# Patient Record
Sex: Female | Born: 1935
Health system: Southern US, Community
[De-identification: ages and names within clinical notes are randomized; demographics above are authoritative.]

## PROBLEM LIST (undated history)

## (undated) DIAGNOSIS — F32A Depression, unspecified: Secondary | ICD-10-CM

## (undated) DIAGNOSIS — I10 Essential (primary) hypertension: Secondary | ICD-10-CM

## (undated) DIAGNOSIS — I4891 Unspecified atrial fibrillation: Secondary | ICD-10-CM

## (undated) DIAGNOSIS — F329 Major depressive disorder, single episode, unspecified: Secondary | ICD-10-CM

---

## 2017-06-26 ENCOUNTER — Other Ambulatory Visit: Payer: Self-pay

## 2017-06-26 ENCOUNTER — Emergency Department (HOSPITAL_BASED_OUTPATIENT_CLINIC_OR_DEPARTMENT_OTHER): Payer: Medicare Other

## 2017-06-26 ENCOUNTER — Encounter (HOSPITAL_BASED_OUTPATIENT_CLINIC_OR_DEPARTMENT_OTHER): Payer: Self-pay | Admitting: Emergency Medicine

## 2017-06-26 ENCOUNTER — Emergency Department (HOSPITAL_BASED_OUTPATIENT_CLINIC_OR_DEPARTMENT_OTHER)
Admission: EM | Admit: 2017-06-26 | Discharge: 2017-06-26 | Disposition: A | Discharge status: Home / Self Care | Payer: Medicare Other | Attending: Emergency Medicine | Admitting: Emergency Medicine

## 2017-06-26 DIAGNOSIS — R0602 Shortness of breath: Secondary | ICD-10-CM | POA: Diagnosis present

## 2017-06-26 DIAGNOSIS — I10 Essential (primary) hypertension: Secondary | ICD-10-CM | POA: Diagnosis not present

## 2017-06-26 DIAGNOSIS — Z7901 Long term (current) use of anticoagulants: Secondary | ICD-10-CM | POA: Diagnosis not present

## 2017-06-26 DIAGNOSIS — I509 Heart failure, unspecified: Secondary | ICD-10-CM | POA: Diagnosis not present

## 2017-06-26 DIAGNOSIS — Z79899 Other long term (current) drug therapy: Secondary | ICD-10-CM | POA: Insufficient documentation

## 2017-06-26 HISTORY — DX: Essential (primary) hypertension: I10

## 2017-06-26 HISTORY — DX: Major depressive disorder, single episode, unspecified: F32.9

## 2017-06-26 HISTORY — DX: Unspecified atrial fibrillation: I48.91

## 2017-06-26 HISTORY — DX: Depression, unspecified: F32.A

## 2017-06-26 LAB — CBC
HEMATOCRIT: 44 % (ref 36.0–46.0)
HEMOGLOBIN: 13.8 g/dL (ref 12.0–15.0)
MCH: 31.5 pg (ref 26.0–34.0)
MCHC: 31.4 g/dL (ref 30.0–36.0)
MCV: 100.5 fL — AB (ref 78.0–100.0)
Platelets: 163 10*3/uL (ref 150–400)
RBC: 4.38 MIL/uL (ref 3.87–5.11)
RDW: 13.1 % (ref 11.5–15.5)
WBC: 5.1 10*3/uL (ref 4.0–10.5)

## 2017-06-26 LAB — I-STAT CHEM 8, ED
BUN: 25 mg/dL — AB (ref 6–20)
CREATININE: 0.8 mg/dL (ref 0.44–1.00)
Calcium, Ion: 1.18 mmol/L (ref 1.15–1.40)
Chloride: 102 mmol/L (ref 101–111)
GLUCOSE: 105 mg/dL — AB (ref 65–99)
HEMATOCRIT: 43 % (ref 36.0–46.0)
HEMOGLOBIN: 14.6 g/dL (ref 12.0–15.0)
POTASSIUM: 5 mmol/L (ref 3.5–5.1)
Sodium: 141 mmol/L (ref 135–145)
TCO2: 31 mmol/L (ref 22–32)

## 2017-06-26 LAB — BRAIN NATRIURETIC PEPTIDE: B Natriuretic Peptide: 263.6 pg/mL — ABNORMAL HIGH (ref 0.0–100.0)

## 2017-06-26 LAB — PROTIME-INR
INR: 1.71
PROTHROMBIN TIME: 19.9 s — AB (ref 11.4–15.2)

## 2017-06-26 LAB — TROPONIN I: Troponin I: <0.03 ng/mL (ref <0.03)

## 2017-06-26 MED ORDER — FUROSEMIDE 20 MG PO TABS: ORAL_TABLET | ORAL | 0 refills | Status: AC

## 2017-06-26 MED ORDER — FUROSEMIDE 10 MG/ML IJ SOLN
60.0000 mg | Freq: Once | INTRAMUSCULAR | Status: AC
  Administered 2017-06-26: 60 mg via INTRAVENOUS
  Filled 2017-06-26: qty 6

## 2017-06-26 MED FILL — FUROSEMIDE 20 MG TABS: 20 | 5 days supply | Qty: 15 | Fill #0

## 2017-06-26 NOTE — ED Provider Notes (Addendum)
[facility] EMERGENCY DEPARTMENT Provider Note   CSN: [id number] Arrival date & time: 06/26/17  1208     History   Chief Complaint Chief Complaint  Patient presents with  . Shortness of Breath   Son utilized as Nurse, learning disability of her native language  HPI Heidi Carlson is a 84 y.o. female.  HPI Patient is a 84 year old female with a history of chronic atrial fibrillation on anticoagulation who presents to the emergency department with lower extremity swelling, weight gain, exertional shortness of breath and new orthopnea.  She states she was taken off of her Lasix by her primary team and reports that since that time she has had progressive symptoms.  No unilateral leg swelling.  No history of DVT or pulmonary embolism.  She is compliant with her meds otherwise.  She has scheduled cardiology follow-up on 22 February.  She states that she felt worse today and that she came to the ER for further evaluation.  No fevers or chills.  Denies productive cough.  No weight loss.   Past Medical History:  Diagnosis Date  . A-fib (HCC)   . Depression   . Hypertension     There are no active problems to display for this patient.   ** The histories are not reviewed yet. Please review them in the "History" navigator section and refresh this SmartLink.  OB History    No data available       Home Medications    Prior to Admission medications   Medication Sig Start Date End Date Taking? Authorizing Provider  albuterol (PROVENTIL HFA;VENTOLIN HFA) 108 (90 Base) MCG/ACT inhaler Inhale 2 puffs into the lungs every 6 (six) hours as needed for wheezing or shortness of breath.   Yes [provider]  diltiazem (CARDIZEM CD) 300 MG 24 hr capsule Take 300 mg by mouth daily.   Yes [provider]  escitalopram (LEXAPRO) 10 MG tablet Take 10 mg by mouth daily.   Yes [provider]  ketotifen (ZADITOR) 0.025 % ophthalmic solution 1 drop as needed.   Yes [provider]  metoprolol tartrate (LOPRESSOR) 100 MG tablet Take 100 mg by mouth 2 (two) times daily.   Yes [provider]  warfarin (COUMADIN) 3 MG tablet Take 4.5 mg by mouth 2 (two) times a week. Takes on Tuesday and Thursday   Yes [provider]  warfarin (COUMADIN) 3 MG tablet Take 3 mg by mouth daily at 6 PM. Pt takes 3 mg daily except on Tuesday and Thursday.  She takes 4.5mg  on those days.   Yes [provider]  furosemide (LASIX) 20 MG tablet 40mg  PO qAM, 20mg  PO qPM x 5 days 06/26/17   Azalia Bilis, MD    Family History No family history on file.  Social History Social History   Tobacco Use  . Smoking status: Never Smoker  . Smokeless tobacco: Never Used  Substance Use Topics  . Alcohol use: Not on file  . Drug use: Not on file     Allergies   Patient has no known allergies.   Review of Systems Review of Systems  All other systems reviewed and are negative.    Physical Exam Updated Vital Signs BP 109/78 (BP Location: Right Arm)   Pulse 84   Temp 97.7 F (36.5 C)   Resp 18   Ht 5\' 5"  (1.651 m)   Wt 108.9 kg (240 lb)   SpO2 97%   BMI 39.94 kg/m   Physical Exam  Constitutional: She is oriented to person, place, and time. She appears well-developed and well-nourished. No distress.  HENT:  Head: Normocephalic and atraumatic.  Eyes: EOM are normal.  Neck: Normal range of motion.  Cardiovascular: Normal rate, regular rhythm and normal heart sounds.  Pulmonary/Chest: Effort normal. No stridor. No respiratory distress. She has no wheezes. She has rales.  Abdominal: Soft. She exhibits no distension. There is no tenderness.  Musculoskeletal: Normal range of motion.  Trace edema bilaterally  Neurological: She is alert and oriented to person, place, and time.  Skin: Skin is warm and dry.  Psychiatric: She has a normal mood and affect. Judgment normal.  Nursing note and vitals reviewed.    ED Treatments / Results  Labs (all  labs ordered are listed, but only abnormal results are displayed) Labs Reviewed  CBC - Abnormal; Notable for the following components:      Result Value   MCV 100.5 (*)    All other components within normal limits  BRAIN NATRIURETIC PEPTIDE - Abnormal; Notable for the following components:   B Natriuretic Peptide 263.6 (*)    All other components within normal limits  PROTIME-INR - Abnormal; Notable for the following components:   Prothrombin Time 19.9 (*)    All other components within normal limits  I-STAT CHEM 8, ED - Abnormal; Notable for the following components:   BUN 25 (*)    Glucose, Bld 105 (*)    All other components within normal limits  TROPONIN I    EKG ECG interpretation 1222pm  Date: 06/26/2017  Rate: 113  Rhythm: afib  QRS Axis: normal  Intervals: normal  ST/T Wave abnormalities: normal  Conduction Disutrbances: none  Narrative Interpretation:   Old EKG Reviewed: No significant changes noted      Radiology Dg Chest 2 View  Result Date: 06/26/2017 CLINICAL DATA:  Shortness of breath for several weeks EXAM: CHEST  2 VIEW COMPARISON:  None. FINDINGS: Cardiac shadow is enlarged. Moderate to large right-sided pleural effusion is noted. There is likely underlying atelectasis/infiltrate. Left lung demonstrates blunting of the left costophrenic angle which may represent a tiny effusion. No bony abnormality is seen. Aortic calcifications are noted. IMPRESSION: Moderate to large right sided pleural effusion with likely underlying atelectasis/infiltrate. Minimal left pleural effusion. Electronically Signed   By: Alcide Clever M.D.   On: 06/26/2017 13:11   Ct Chest Wo Contrast  Result Date: 06/26/2017 CLINICAL DATA:  Shortness of breath over the last 3 weeks. Swelling of the legs and weight gain. EXAM: CT CHEST WITHOUT CONTRAST TECHNIQUE: Multidetector CT imaging of the chest was performed following the standard protocol without IV contrast. COMPARISON:  Radiography same  day FINDINGS: Cardiovascular: The heart is enlarged. There is mild coronary artery calcification. There is aortic atherosclerotic calcification without aneurysm. There is a small amount of pericardial fluid. Mediastinum/Nodes: No mass or lymphadenopathy. Calcified mediastinal nodes in the subcarinal region. Lungs/Pleura: Bilateral effusions, more extensive on the right than the left. Dependent pulmonary atelectasis right more than left. Volume loss is most pronounced in the right lower lobe. Calcified granuloma in the right middle lobe. Age related extensive tracheobronchial calcification. Upper Abdomen: Negative Musculoskeletal: No fracture or significant focal finding. IMPRESSION: Cardiomegaly. Aortic atherosclerosis. Some coronary artery calcification. Small pericardial effusion. Bilateral pleural effusions layering dependently right larger than left with associated atelectasis. Findings consistent with congestive heart failure. Right middle lobe calcified granuloma and calcified subcarinal lymph nodes. Aortic Atherosclerosis (ICD10-I70.0). Electronically Signed   By: Scherrie Bateman.D.  On: 06/26/2017 15:05    Procedures Procedures (including critical care time)  Medications Ordered in ED Medications  furosemide (LASIX) injection 60 mg (60 mg Intravenous Given 06/26/17 1359)     Initial Impression / Assessment and Plan / ED Course  I have reviewed the triage vital signs and the nursing notes.  Pertinent labs & imaging results that were available during my care of the patient were reviewed by me and considered in my medical decision making (see chart for details).     IV Lasix given in the emergency department with initiation of diuresis.  CT imaging of the chest confirms bilateral pleural effusions right greater than left likely consistent with congestive heart failure exacerbation.  Her clinical picture suggest CHF exacerbation.  Despite the patient's advanced age she is very eager and being  discharged and does not want to be admitted the hospital.  Given her response to diuresis here in the emergency department and feeling better at this time I think this is reasonable.  I recommended close outpatient cardiology follow-up and home with reinitiation of her Lasix.  40 mg in the morning.  20 mg at night.  Prescriptions written for 5 days.  She understands to return to the emergency department for new or worsening symptoms.  She understands the importance of close cardiac follow-up.  Final Clinical Impressions(s) / ED Diagnoses   Final diagnoses:  Acute on chronic congestive heart failure, unspecified heart failure type [facility](HCC)    ED Discharge Orders        Ordered    furosemide (LASIX) 20 MG tablet     06/26/17 1553       Azalia Bilisampos, Cincere Deprey, MD 06/26/17 1601    Azalia Bilisampos, Haeley Fordham, MD 06/26/17 [phone]

## 2017-06-26 NOTE — Discharge Instructions (Signed)
Please call your cardiologist for follow up in 5 days

## 2017-06-26 NOTE — ED Triage Notes (Signed)
Pt having sob for 3 weeks.  Pt states her legs are swelling and she has gained weight.  Pt has not been taking her fluid pills because her cardiologist told her to stop taking them due to interaction with her coumadin.

## 2017-12-21 DEATH — deceased

## 2018-08-29 IMAGING — CT CT CHEST W/O CM
2 of 3 series · 15 of 36 positions shown, 18 images · non-contrast
Comparison: Radiography same day

CLINICAL DATA: Shortness of breath over the last 3 weeks. Swelling
of the legs and weight gain.

EXAM:
CT CHEST WITHOUT CONTRAST
TECHNIQUE: Multidetector CT imaging of the chest was performed following the
standard protocol without IV contrast.

[Series 2: thorax · axial · 0.72mm/px · z∈[-354,-124]mm · 12 of 136 slices shown, 15 images]
[im 11/136  mediastinal]
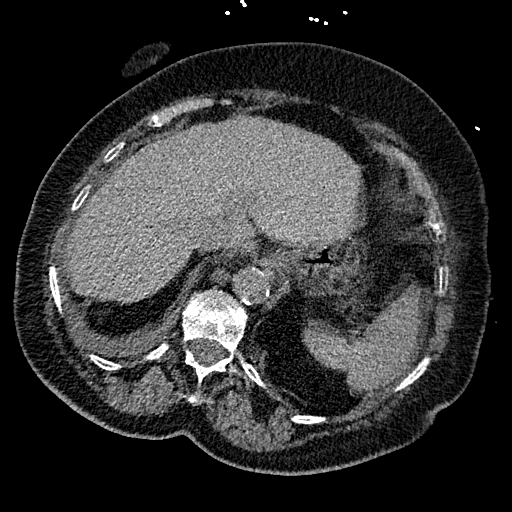
[im 11/136  lung]
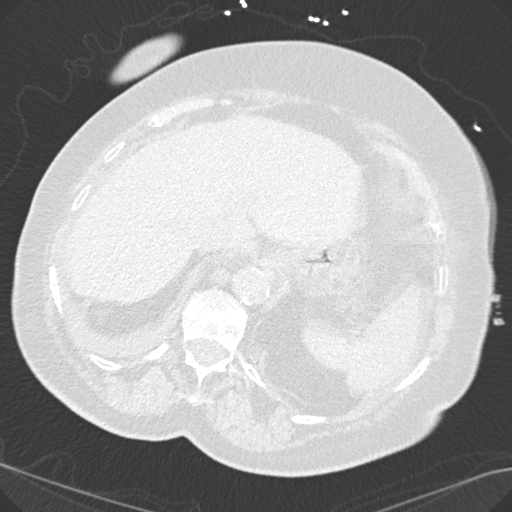
[im 21/136  lung]
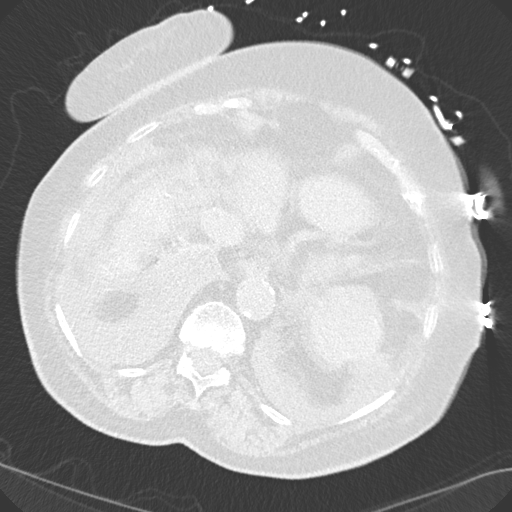
[im 31/136  lung]
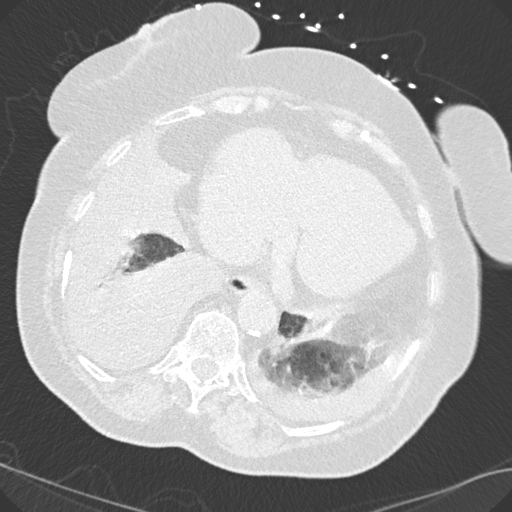
[im 41/136  lung]
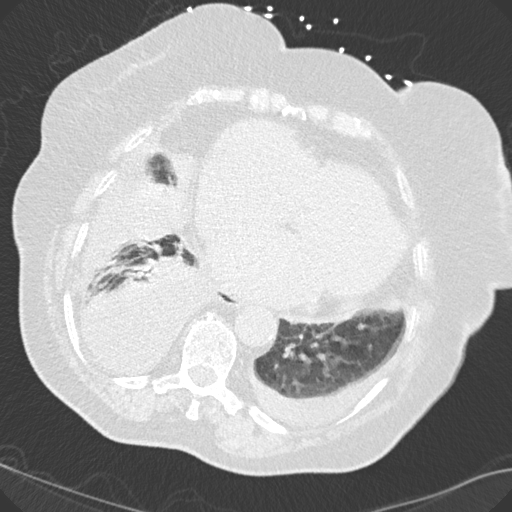
[im 51/136  mediastinal]
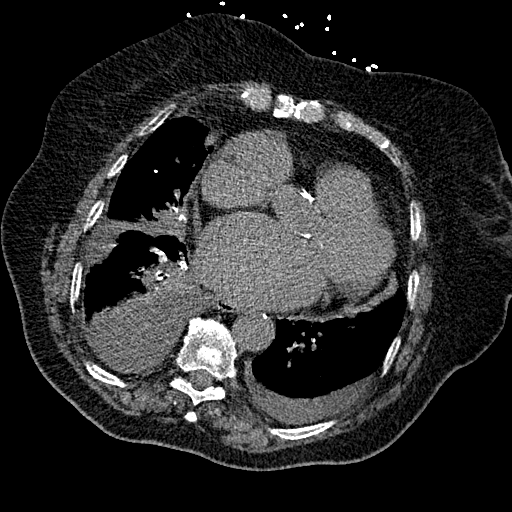
[im 51/136  lung]
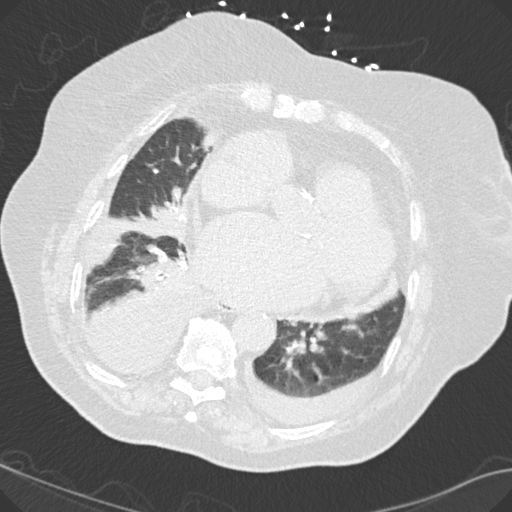
[im 61/136  lung]
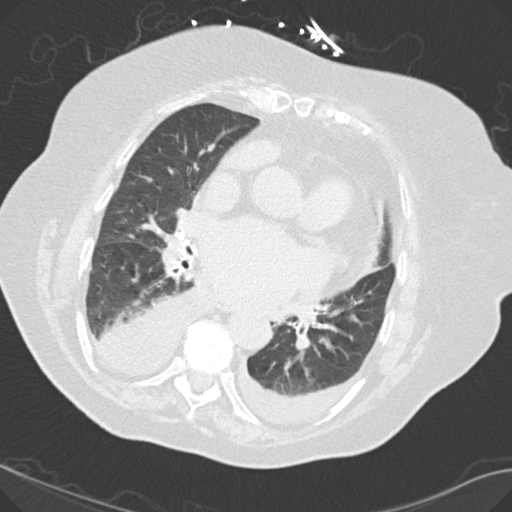
[im 76/136  lung]
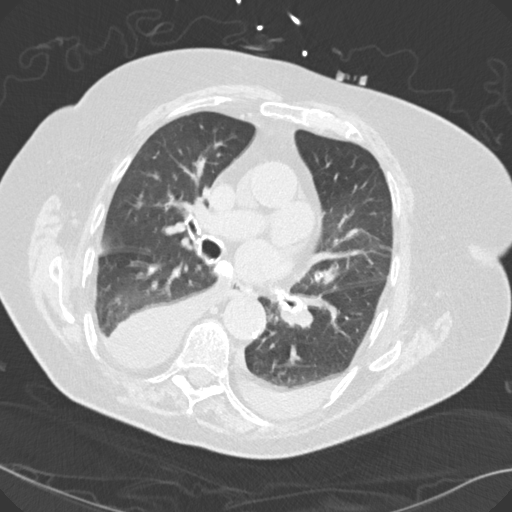
[im 86/136  lung]
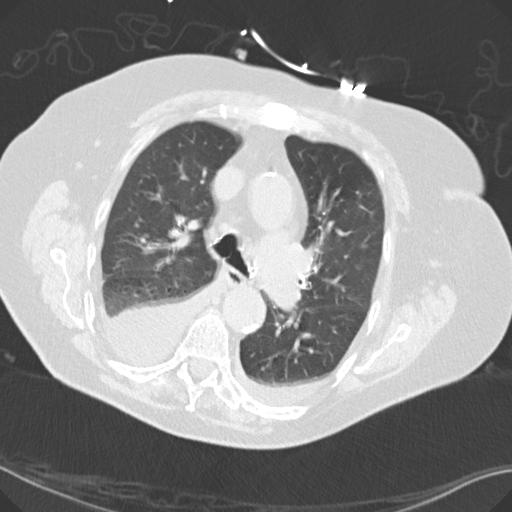
[im 96/136  mediastinal]
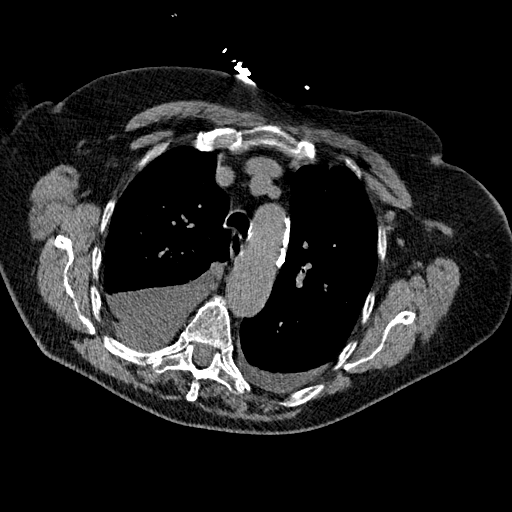
[im 96/136  lung]
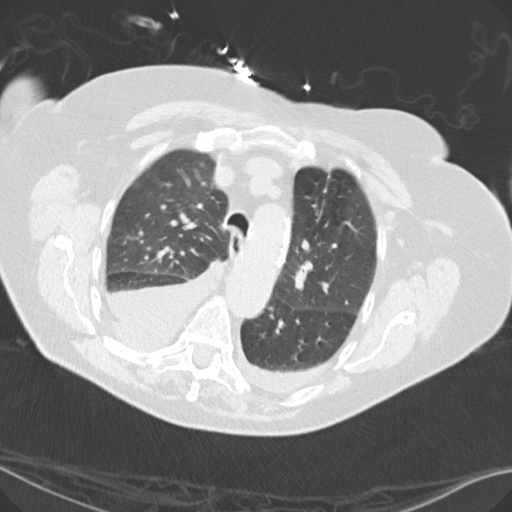
[im 106/136  lung]
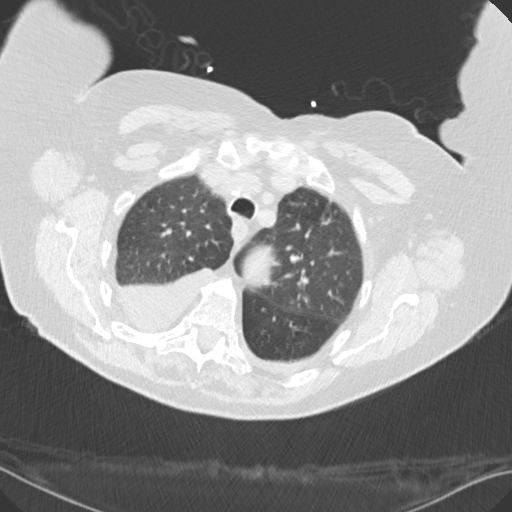
[im 116/136  lung]
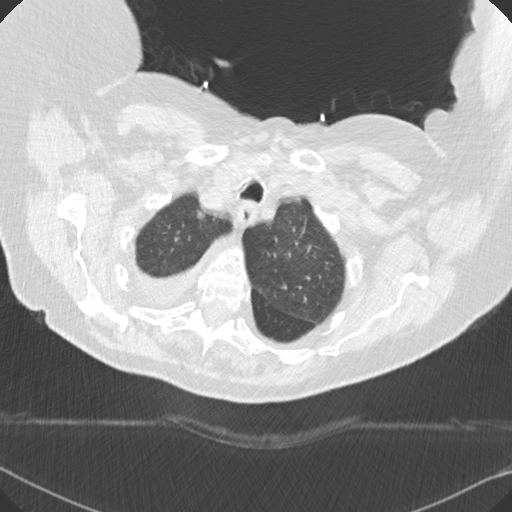
[im 126/136  lung]
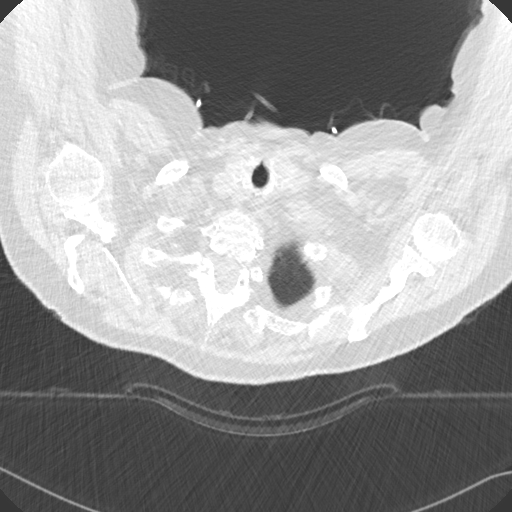

[Series 5: coronal · coronal · 0.59mm/px · 3 of 151 slices shown]
[im 31/151  lung]
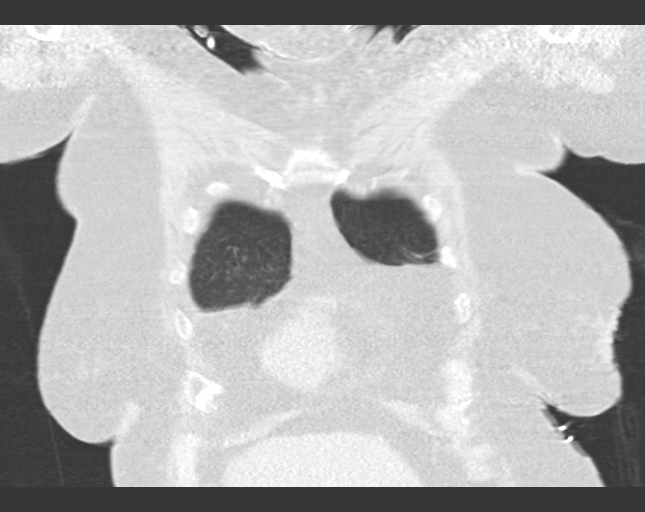
[im 61/151  lung]
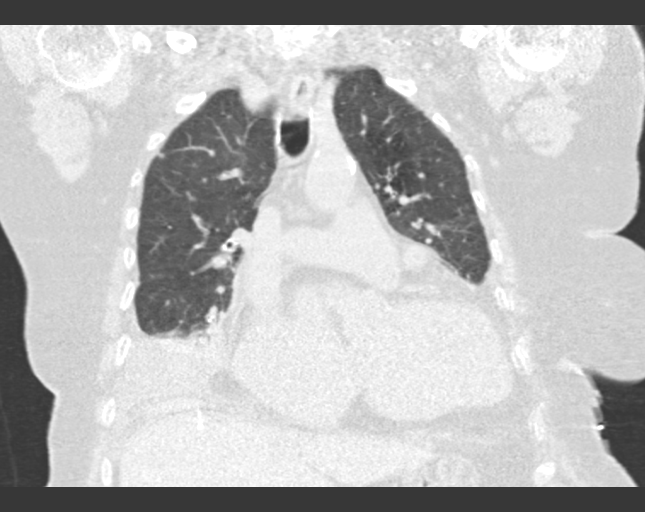
[im 91/151  lung]
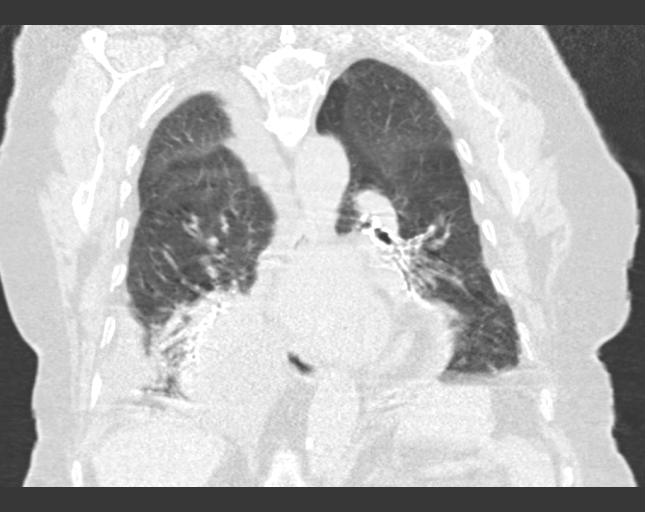

[15 of 36 positions shown; findings below may reference images not displayed]

FINDINGS: Cardiovascular: The heart is enlarged. There is mild coronary artery
calcification. There is aortic atherosclerotic calcification without
aneurysm. There is a small amount of pericardial fluid.

Mediastinum/Nodes: No mass or lymphadenopathy. Calcified mediastinal
nodes in the subcarinal region.

Lungs/Pleura: Bilateral effusions, more extensive on the right than
the left. Dependent pulmonary atelectasis right more than left.
Volume loss is most pronounced in the right lower lobe. Calcified
granuloma in the right middle lobe. Age related extensive
tracheobronchial calcification.

Upper Abdomen: Negative

Musculoskeletal: No fracture or significant focal finding.
IMPRESSION: Cardiomegaly. Aortic atherosclerosis. Some coronary artery
calcification. Small pericardial effusion. Bilateral pleural
effusions layering dependently right larger than left with
associated atelectasis. Findings consistent with congestive heart
failure.

Right middle lobe calcified granuloma and calcified subcarinal lymph
nodes.

Aortic Atherosclerosis ([id number]).
